# Patient Record
Sex: Female | Born: 1985 | Race: White | Hispanic: Yes | Marital: Married | Smoking: Never smoker
Health system: Southern US, Community
[De-identification: ages and names within clinical notes are randomized; demographics above are authoritative.]

## PROBLEM LIST (undated history)

## (undated) DIAGNOSIS — I10 Essential (primary) hypertension: Secondary | ICD-10-CM

## (undated) HISTORY — DX: Essential (primary) hypertension: I10

## (undated) HISTORY — PX: GALLBLADDER SURGERY: SHX652

---

## 2005-10-03 ENCOUNTER — Ambulatory Visit: Payer: Self-pay | Admitting: Family Medicine

## 2007-03-08 ENCOUNTER — Emergency Department: Payer: Self-pay | Admitting: Emergency Medicine

## 2007-03-08 ENCOUNTER — Other Ambulatory Visit: Payer: Self-pay

## 2010-01-24 ENCOUNTER — Ambulatory Visit: Payer: Self-pay | Admitting: Nurse Practitioner

## 2010-03-08 ENCOUNTER — Emergency Department: Payer: Self-pay | Admitting: Emergency Medicine

## 2010-08-07 ENCOUNTER — Emergency Department: Payer: Self-pay | Admitting: Emergency Medicine

## 2012-10-01 ENCOUNTER — Emergency Department: Payer: Self-pay | Admitting: Emergency Medicine

## 2012-10-01 LAB — COMPREHENSIVE METABOLIC PANEL
Albumin: 3.8 g/dL (ref 3.4–5.0)
Alkaline Phosphatase: 80 U/L (ref 50–136)
Anion Gap: 3 — ABNORMAL LOW (ref 7–16)
BUN: 8 mg/dL (ref 7–18)
Bilirubin,Total: 0.2 mg/dL (ref 0.2–1.0)
Calcium, Total: 8.9 mg/dL (ref 8.5–10.1)
Chloride: 106 mmol/L (ref 98–107)
Co2: 27 mmol/L (ref 21–32)
EGFR (Non-African Amer.): >60
Osmolality: 270 (ref 275–301)
SGPT (ALT): 19 U/L (ref 12–78)
Total Protein: 7.9 g/dL (ref 6.4–8.2)

## 2012-10-01 LAB — CBC
HCT: 23 % — ABNORMAL LOW (ref 35.0–47.0)
MCH: 18.9 pg — ABNORMAL LOW (ref 26.0–34.0)
MCV: 61 fL — ABNORMAL LOW (ref 80–100)
RDW: 19.2 % — ABNORMAL HIGH (ref 11.5–14.5)

## 2012-10-01 LAB — URINALYSIS, COMPLETE
Bilirubin,UR: NEGATIVE
Leukocyte Esterase: NEGATIVE
Ph: 6 (ref 4.5–8.0)
RBC,UR: 2 /HPF (ref 0–5)
Specific Gravity: 1.005 (ref 1.003–1.030)
Squamous Epithelial: 1
WBC UR: 2 /HPF (ref 0–5)

## 2012-11-30 IMAGING — CT CT ABD-PELV W/ CM
1 of 2 series · 15 of 32 positions shown, 19 images · non-contrast
Comparison: none

REASON FOR EXAM: (1) LUQ pain; (2) LUQ pain
COMMENTS:   May transport without cardiac monitor

PROCEDURE:     CT  - CT ABDOMEN / PELVIS  W  - August 08, 2010  [DATE]
RESULT:     CT abdomen and pelvis dated 08/08/2010
TECHNIQUE: Helical 3 mm sections were obtained from lung bases through the
pubic symphysis status post intravenous administration of 100 mL of
[id number] and oral contrast.

[Series 2: 3mm soft tissue · axial · 0.68mm/px · z∈[+342,+756]mm · 15 of 152 slices shown, 19 images]
[im 7/152  soft-tissue]
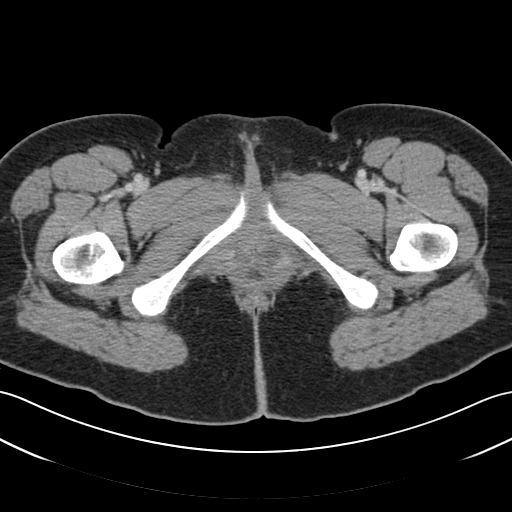
[im 7/152  bone]
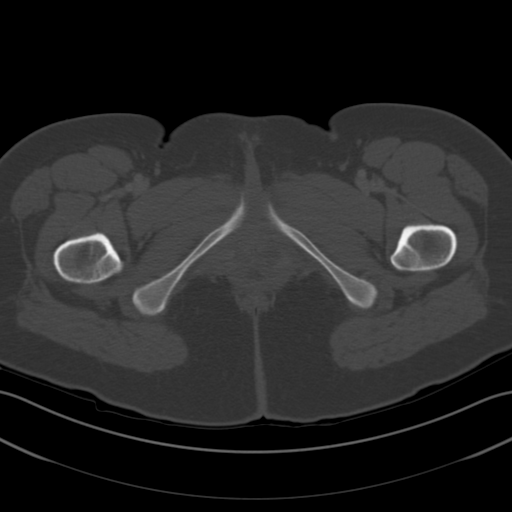
[im 19/152  soft-tissue]
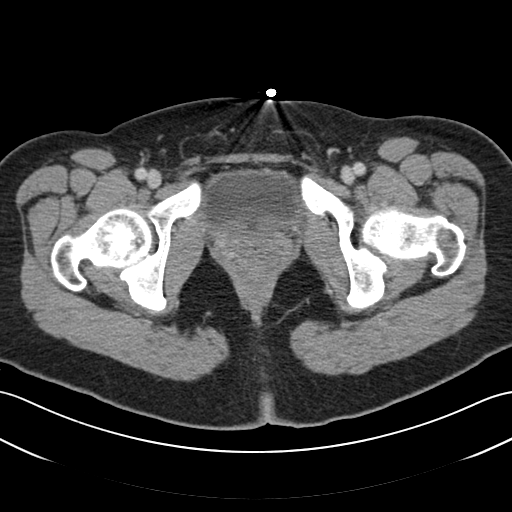
[im 32/152  soft-tissue]
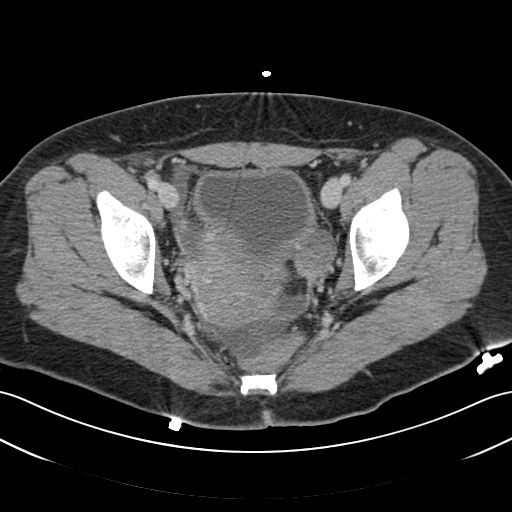
[im 45/152  soft-tissue]
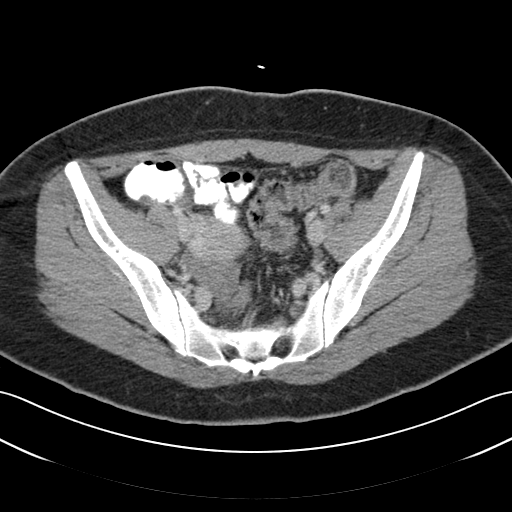
[im 51/152  soft-tissue]
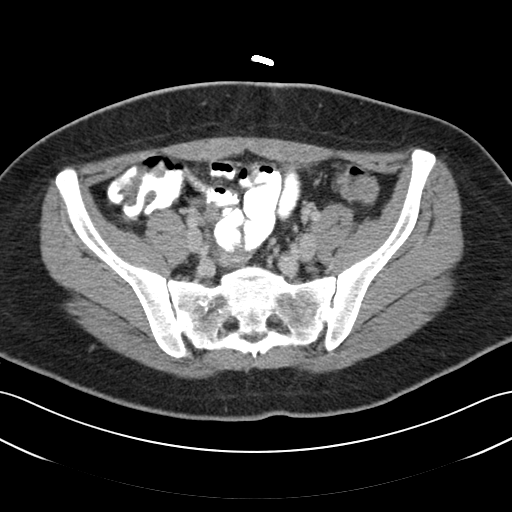
[im 63/152  soft-tissue]
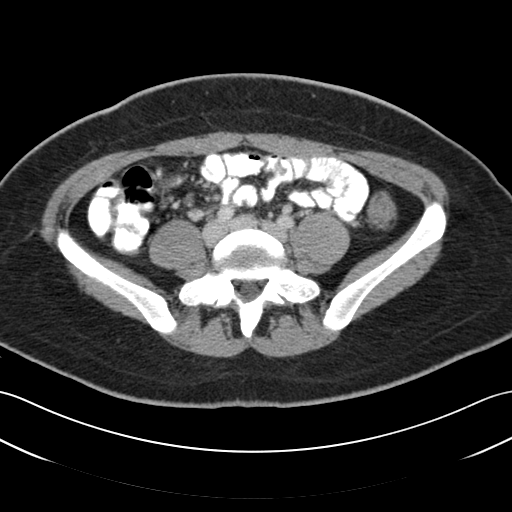
[im 76/152  soft-tissue]
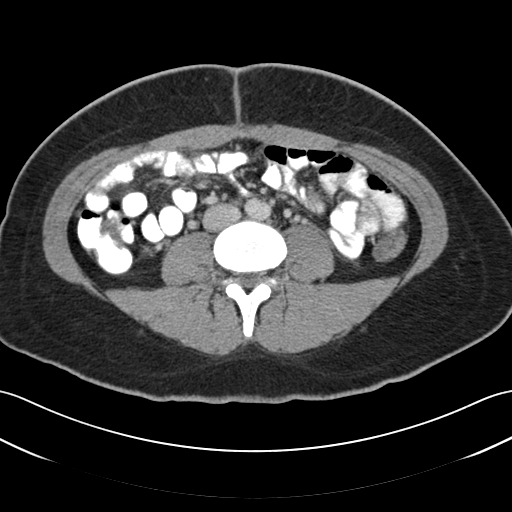
[im 89/152  soft-tissue]
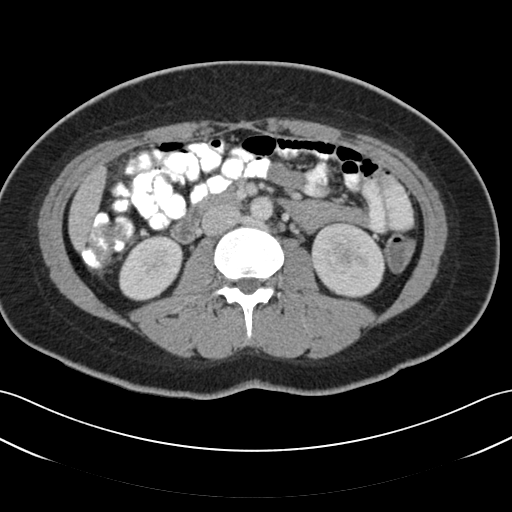
[im 101/152  soft-tissue]
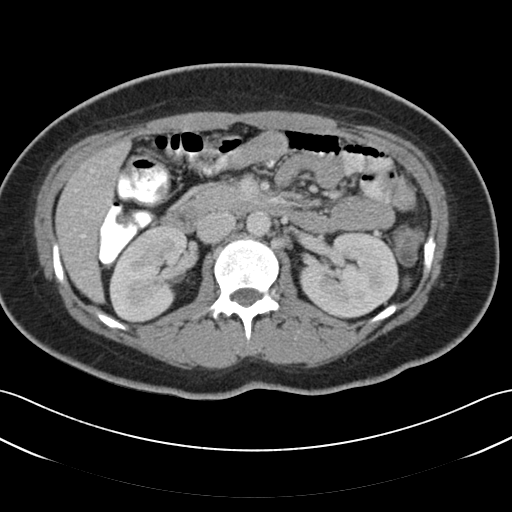
[im 101/152  bone]
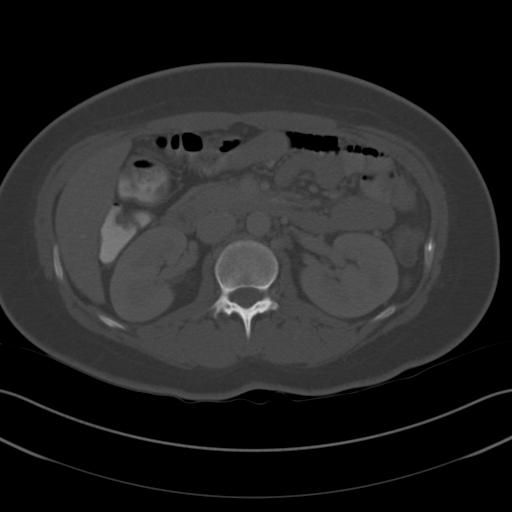
[im 107/152  soft-tissue]
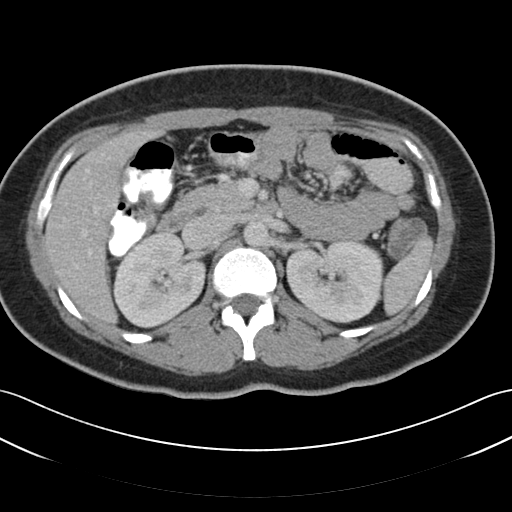
[im 120/152  soft-tissue]
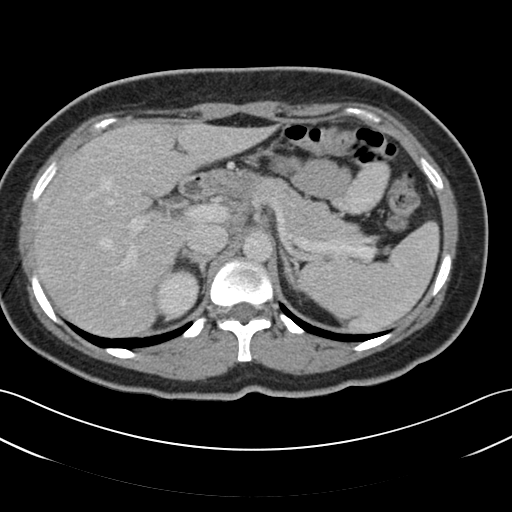
[im 126/152  lung]
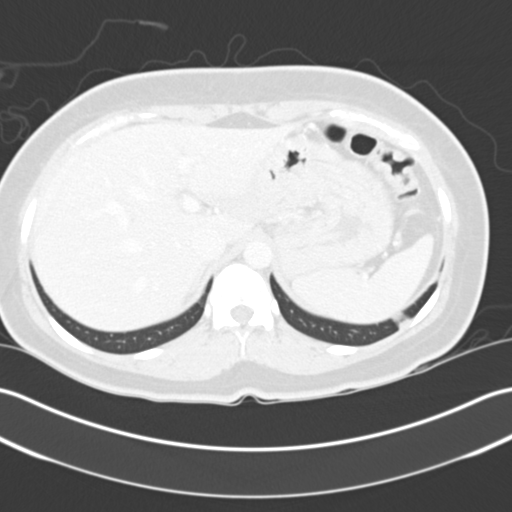
[im 133/152  soft-tissue]
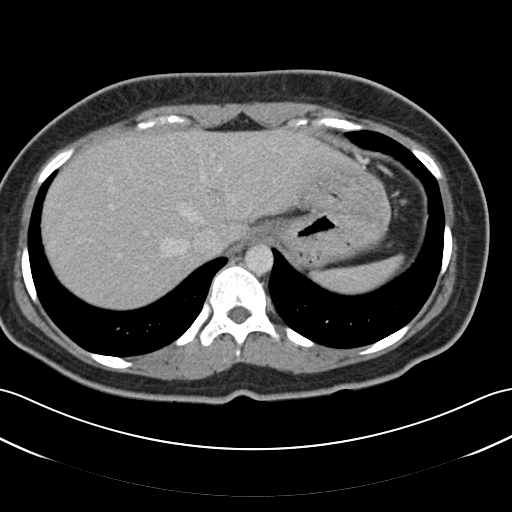
[im 133/152  lung]
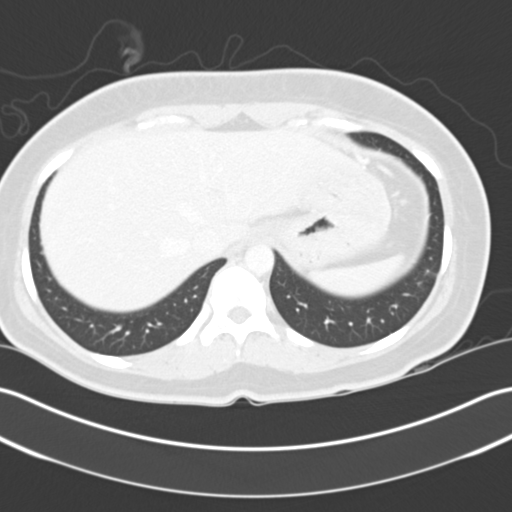
[im 139/152  lung]
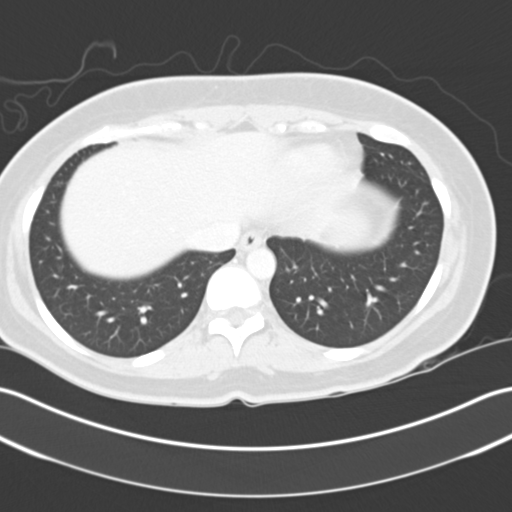
[im 145/152  soft-tissue]
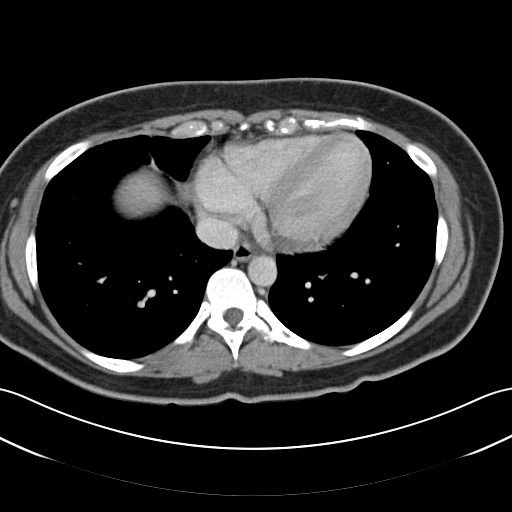
[im 145/152  lung]
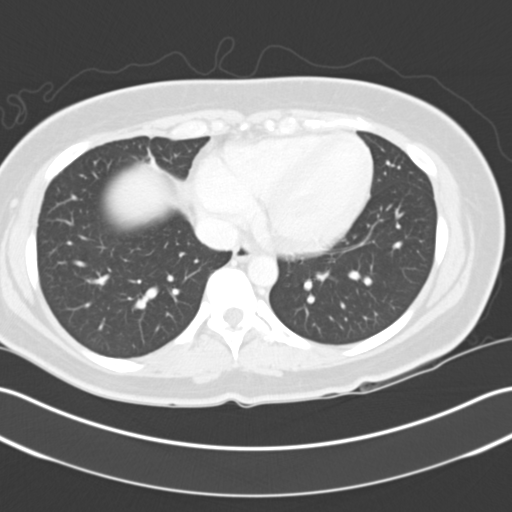

[15 of 32 positions shown; findings below may reference images not displayed]

FINDINGS: The lung bases are grossly unremarkable.

The liver, spleen, adrenals, pancreas, kidneys unremarkable. There is no CT
evidence of bowel obstruction nor secondary signs reflecting enteritis,
colitis, diverticulitis. The appendix is distended measuring 1.05 cm in
diameter. There is otherwise no evidence of appreciable periappendiceal
fluid no periappendiceal inflammatory changes. A small to moderate amount of
fluid is appreciated within the pelvis. There is no evidence of loculated
fluid collections, masses, nor adenopathy. There is no evidence of an
abdominal aortic aneurysm.
IMPRESSION: The appendix is thickened as described above without
further CT evidence of inflammatory changes. Clinical correlation
recommended.
2. Small to moderate amount fluid within the pelvis may be physiologic and
clinical correlation recommended.
3. No further CT evidence of obstructive or inflammatory abnormalities.

## 2013-08-13 ENCOUNTER — Ambulatory Visit: Payer: Self-pay | Admitting: Family Medicine

## 2016-07-08 ENCOUNTER — Ambulatory Visit (INDEPENDENT_AMBULATORY_CARE_PROVIDER_SITE_OTHER): Payer: BLUE CROSS/BLUE SHIELD | Admitting: Obstetrics & Gynecology

## 2016-07-08 ENCOUNTER — Encounter: Payer: Self-pay | Admitting: Obstetrics & Gynecology

## 2016-07-08 VITALS — BP 120/80 | HR 67 | Ht 65.0 in | Wt 193.0 lb

## 2016-07-08 DIAGNOSIS — Z124 Encounter for screening for malignant neoplasm of cervix: Secondary | ICD-10-CM | POA: Diagnosis not present

## 2016-07-08 DIAGNOSIS — Z0000 Encounter for general adult medical examination without abnormal findings: Secondary | ICD-10-CM | POA: Diagnosis not present

## 2016-07-08 DIAGNOSIS — E669 Obesity, unspecified: Secondary | ICD-10-CM | POA: Diagnosis not present

## 2016-07-08 NOTE — Progress Notes (Signed)
   HPI:      [name] is a 31 y.o. G0P0000 who LMP was No LMP recorded., she presents today for her annual examination. The patient has no complaints today. The patient is sexually active. Her last pap: was normal and 3 years ago. The patient does perform self breast exams.  There is no notable family history of breast or ovarian cancer in her family.  The patient has regular exercise: yes.  The patient denies current symptoms of depression.    GYN History: Contraception: OCP (estrogen/progesterone)  PMHx: Past Medical History:  Diagnosis Date  . Hypertension    Past Surgical History:  Procedure Laterality Date  . GALLBLADDER SURGERY     Family History  Problem Relation Age of Onset  . Lung cancer Paternal Aunt   . Throat cancer Maternal Grandfather    Social History  Substance Use Topics  . Smoking status: Never Smoker  . Smokeless tobacco: Never Used  . Alcohol use No    Current Outpatient Prescriptions:  .  lisinopril (PRINIVIL,ZESTRIL) 10 MG tablet, Take 10 mg by mouth daily., Disp: , Rfl:  .  Norgestimate-Ethinyl Estradiol Triphasic (TRI-SPRINTEC) 0.18/0.215/0.25 MG-35 MCG tablet, Take 1 tablet by mouth daily., Disp: , Rfl:  Allergies: Patient has no known allergies.  ROS  Objective: BP 120/80   Pulse 67   Ht 5\' 5"  (1.651 m)   Wt 193 lb (87.5 kg)   BMI 32.12 kg/m   Filed Weights   07/08/16 1500  Weight: 193 lb (87.5 kg)   Body mass index is 32.12 kg/m. OBGyn Exam  Assessment:  ANNUAL EXAM 1. Annual physical exam   2. Screening for cervical cancer   3. Obesity (BMI 30-39.9)      Screening Plan:            1.  Cervical Screening-  Pap smear done today  2. Breast screening- Exam annually and mammogram>40 planned   3. Colonoscopy every 10 years, Hemoccult testing - after age 31  4. Labs managed by PCP  5. Counseling for contraception: oral contraceptives (estrogen/progesterone)  Other:  1. Annual physical exam 2. Obesity Weight  loss counseled.  Diet and exercise. Meds discussed.    F/U  Return in about 1 year (around 07/08/2017) for Annual.  Annamarie MajorPaul Analyse Angst, MD, Merlinda FrederickFACOG [facility], [facility] 07/08/2016  3:22 PM

## 2016-07-08 NOTE — Patient Instructions (Signed)

## 2016-07-11 LAB — IGP, APTIMA HPV
HPV Aptima: NEGATIVE
PAP SMEAR COMMENT: 0

## 2018-02-11 ENCOUNTER — Encounter: Payer: Self-pay | Admitting: Obstetrics & Gynecology

## 2018-02-11 ENCOUNTER — Ambulatory Visit (INDEPENDENT_AMBULATORY_CARE_PROVIDER_SITE_OTHER): Payer: BLUE CROSS/BLUE SHIELD | Admitting: Obstetrics & Gynecology

## 2018-02-11 VITALS — BP 120/80 | Ht 65.0 in | Wt 212.0 lb

## 2018-02-11 DIAGNOSIS — Z01419 Encounter for gynecological examination (general) (routine) without abnormal findings: Secondary | ICD-10-CM | POA: Diagnosis not present

## 2018-02-11 DIAGNOSIS — E669 Obesity, unspecified: Secondary | ICD-10-CM

## 2018-02-11 DIAGNOSIS — Z0000 Encounter for general adult medical examination without abnormal findings: Secondary | ICD-10-CM

## 2018-02-11 MED ORDER — NORGESTIMATE-ETH ESTRADIOL 0.25-35 MG-MCG PO TABS: 1.0000 | ORAL_TABLET | Freq: Every day | ORAL | 3 refills | Status: DC

## 2018-02-11 NOTE — Patient Instructions (Signed)
PAP every three years Labs yearly (with PCP)   

## 2018-02-11 NOTE — Progress Notes (Signed)
HPI:      Jenna Reese is a 32 y.o. G0P0000 who LMP was Patient's last menstrual period was 01/22/2018., she presents today for her annual examination. The patient has no complaints today. The patient is sexually active. Her last pap: approximate date 2018 and was normal. The patient does perform self breast exams.  There is no notable family history of breast or ovarian cancer in her family.  The patient has regular exercise: yes.  The patient denies current symptoms of depression.    GYN History: Contraception: OCP (estrogen/progesterone)  PMHx: Past Medical History:  Diagnosis Date  . Hypertension    Past Surgical History:  Procedure Laterality Date  . GALLBLADDER SURGERY     Family History  Problem Relation Age of Onset  . Lung cancer Paternal Aunt   . Throat cancer Maternal Grandfather    Social History   Tobacco Use  . Smoking status: Never Smoker  . Smokeless tobacco: Never Used  Substance Use Topics  . Alcohol use: No  . Drug use: No    Current Outpatient Medications:  .  lisinopril (PRINIVIL,ZESTRIL) 10 MG tablet, Take 10 mg by mouth daily., Disp: , Rfl:  .  norgestimate-ethinyl estradiol (SPRINTEC 28) 0.25-35 MG-MCG tablet, Take 1 tablet by mouth daily., Disp: 84 tablet, Rfl: 3 Allergies: Patient has no known allergies.  Review of Systems  Constitutional: Negative for chills, fever and malaise/fatigue.  HENT: Negative for congestion, sinus pain and sore throat.   Eyes: Negative for blurred vision and pain.  Respiratory: Negative for cough and wheezing.   Cardiovascular: Negative for chest pain and leg swelling.  Gastrointestinal: Negative for abdominal pain, constipation, diarrhea, heartburn, nausea and vomiting.  Genitourinary: Negative for dysuria, frequency, hematuria and urgency.  Musculoskeletal: Negative for back pain, joint pain, myalgias and neck pain.  Skin: Negative for itching and rash.  Neurological: Negative for dizziness, tremors and  weakness.  Endo/Heme/Allergies: Does not bruise/bleed easily.  Psychiatric/Behavioral: Negative for depression. The patient is not nervous/anxious and does not have insomnia.     Objective: BP 120/80   Ht 5\' 5"  (1.651 m)   Wt 212 lb (96.2 kg)   LMP 01/22/2018   BMI 35.28 kg/m   Filed Weights   02/11/18 0804  Weight: 212 lb (96.2 kg)   Body mass index is 35.28 kg/m. Physical Exam Constitutional:      General: She is not in acute distress.    Appearance: She is well-developed.  Genitourinary:     Pelvic exam was performed with patient supine.     Vagina, uterus and rectum normal.     No lesions in the vagina.     No vaginal bleeding.     No cervical motion tenderness, friability, lesion or polyp.     Uterus is mobile.     Uterus is not enlarged.     No uterine mass detected.    Uterus is midaxial.     No right or left adnexal mass present.     Right adnexa not tender.     Left adnexa not tender.  HENT:     Head: Normocephalic and atraumatic. No laceration.     Right Ear: Hearing normal.     Left Ear: Hearing normal.     Mouth/Throat:     Pharynx: Uvula midline.  Eyes:     Pupils: Pupils are equal, round, and reactive to light.  Neck:     Musculoskeletal: Normal range of motion and neck supple.  Thyroid: No thyromegaly.  Cardiovascular:     Rate and Rhythm: Normal rate and regular rhythm.     Heart sounds: No murmur. No friction rub. No gallop.   Pulmonary:     Effort: Pulmonary effort is normal. No respiratory distress.     Breath sounds: Normal breath sounds. No wheezing.  Chest:     Breasts:        Right: No mass, skin change or tenderness.        Left: No mass, skin change or tenderness.  Abdominal:     General: Bowel sounds are normal. There is no distension.     Palpations: Abdomen is soft.     Tenderness: There is no abdominal tenderness. There is no rebound.  Musculoskeletal: Normal range of motion.  Neurological:     Mental Status: She is alert  and oriented to person, place, and time.     Cranial Nerves: No cranial nerve deficit.  Skin:    General: Skin is warm and dry.  Psychiatric:        Judgment: Judgment normal.  Vitals signs reviewed.     Assessment:  ANNUAL EXAM 1. Annual physical exam   2. Obesity (BMI 30-39.9)    Screening Plan:            1.  Cervical Screening-  Pap smear schedule reviewed with patient  2. Breast screening- Exam annually and mammogram>40 planned   3. Labs managed by PCP  4. Counseling for contraception: oral contraceptives (estrogen/progesterone)   5. Obesity (BMI 30-39.9) Diet and exercise discussed  Pt plans trying for pregnancy next year    F/U  Return in about 1 year (around 02/12/2019) for Annual.  Jenna MajorPaul Eidan Muellner, MD, Merlinda FrederickFACOG [facility], [facility] 02/11/2018  8:19 AM

## 2018-12-12 ENCOUNTER — Other Ambulatory Visit: Payer: Self-pay

## 2018-12-12 ENCOUNTER — Emergency Department
Admission: EM | Admit: 2018-12-12 | Discharge: 2018-12-12 | Disposition: A | Discharge status: Home / Self Care | Payer: Managed Care, Other (non HMO) | Attending: Emergency Medicine | Admitting: Emergency Medicine

## 2018-12-12 ENCOUNTER — Emergency Department: Payer: Managed Care, Other (non HMO)

## 2018-12-12 DIAGNOSIS — S99922A Unspecified injury of left foot, initial encounter: Secondary | ICD-10-CM | POA: Diagnosis present

## 2018-12-12 DIAGNOSIS — S92505A Nondisplaced unspecified fracture of left lesser toe(s), initial encounter for closed fracture: Secondary | ICD-10-CM

## 2018-12-12 DIAGNOSIS — Y929 Unspecified place or not applicable: Secondary | ICD-10-CM | POA: Diagnosis not present

## 2018-12-12 DIAGNOSIS — Y999 Unspecified external cause status: Secondary | ICD-10-CM | POA: Diagnosis not present

## 2018-12-12 DIAGNOSIS — W228XXA Striking against or struck by other objects, initial encounter: Secondary | ICD-10-CM | POA: Insufficient documentation

## 2018-12-12 DIAGNOSIS — I10 Essential (primary) hypertension: Secondary | ICD-10-CM | POA: Insufficient documentation

## 2018-12-12 DIAGNOSIS — Y939 Activity, unspecified: Secondary | ICD-10-CM | POA: Diagnosis not present

## 2018-12-12 DIAGNOSIS — Z79899 Other long term (current) drug therapy: Secondary | ICD-10-CM | POA: Insufficient documentation

## 2018-12-12 MED ORDER — HYDROCODONE-ACETAMINOPHEN 5-325 MG PO TABS: 1.0000 | ORAL_TABLET | Freq: Four times a day (QID) | ORAL | 0 refills | Status: DC | PRN

## 2018-12-12 MED ORDER — IBUPROFEN 800 MG PO TABS: 800.0000 mg | ORAL_TABLET | Freq: Three times a day (TID) | ORAL | 0 refills | Status: DC | PRN

## 2018-12-12 MED ORDER — KETOROLAC TROMETHAMINE 30 MG/ML IJ SOLN
30.0000 mg | Freq: Once | INTRAMUSCULAR | Status: AC
  Administered 2018-12-12: 30 mg via INTRAMUSCULAR
  Filled 2018-12-12: qty 1

## 2018-12-12 NOTE — ED Triage Notes (Signed)
Pt states she stubbed her left 5th toe approx 1 hour pta. Pt ambulatory without difficulty.

## 2018-12-12 NOTE — ED Notes (Signed)
Pt back from x-ray.

## 2018-12-12 NOTE — Discharge Instructions (Signed)
1.  You may take pain medicines as needed (Motrin/Norco #15). 2.  Buddy tape your left fourth and fifth toes together.  Wear podiatric shoe as needed for comfort. 3.  Return to the ER for worsening symptoms, persistent vomiting, difficulty breathing or other concerns.

## 2018-12-12 NOTE — ED Provider Notes (Signed)
[facility] Emergency Department Provider Note   ____________________________________________   First MD Initiated Contact with Patient 12/12/18 [phone]     (approximate)  I have reviewed the triage vital signs and the nursing notes.   HISTORY  Chief Complaint Toe Injury    HPI Jenna Reese is a 33 y.o. female who presents to the ED from home with a painful left pinky toe.  Patient states she stubbed her toe approximately 2 AM.  No medicines taken prior to arrival.  Voices no other complaints or injuries.      Past Medical History:  Diagnosis Date  . Hypertension     Patient Active Problem List   Diagnosis Date Noted  . Obesity (BMI 30-39.9) 07/08/2016    Past Surgical History:  Procedure Laterality Date  . GALLBLADDER SURGERY      Prior to Admission medications   Medication Sig Start Date End Date Taking? Authorizing Provider  HYDROcodone-acetaminophen (NORCO) 5-325 MG tablet Take 1 tablet by mouth every 6 (six) hours as needed for moderate pain. 12/12/18   Paulette Blanch, MD  ibuprofen (ADVIL) 800 MG tablet Take 1 tablet (800 mg total) by mouth every 8 (eight) hours as needed for moderate pain. 12/12/18   Paulette Blanch, MD  lisinopril (PRINIVIL,ZESTRIL) 10 MG tablet Take 10 mg by mouth daily.    [provider]  norgestimate-ethinyl estradiol ([place] 28) 0.25-35 MG-MCG tablet Take 1 tablet by mouth daily. 02/11/18 05/06/18  Gae Dry, MD    Allergies Patient has no known allergies.  Family History  Problem Relation Age of Onset  . Lung cancer Paternal Aunt   . Throat cancer Maternal Grandfather     Social History Social History   Tobacco Use  . Smoking status: Never Smoker  . Smokeless tobacco: Never Used  Substance Use Topics  . Alcohol use: No  . Drug use: No    Review of Systems  Constitutional: No fever/chills Eyes: No visual changes. ENT: No sore throat. Cardiovascular: Denies chest pain.  Respiratory: Denies shortness of breath. Gastrointestinal: No abdominal pain.  No nausea, no vomiting.  No diarrhea.  No constipation. Genitourinary: Negative for dysuria. Musculoskeletal: Positive for left fifth toe pain.  Negative for back pain. Skin: Negative for rash. Neurological: Negative for headaches, focal weakness or numbness.   ____________________________________________   PHYSICAL EXAM:  VITAL SIGNS: ED Triage Vitals  Enc Vitals Group     BP 12/12/18 0316 (!) 155/99     Pulse Rate 12/12/18 0316 86     Resp 12/12/18 0316 18     Temp 12/12/18 0316 98.3 F (36.8 C)     Temp Source 12/12/18 0316 Oral     SpO2 12/12/18 0316 100 %     Weight 12/12/18 0303 200 lb (90.7 kg)     Height 12/12/18 0303 5\' 5"  (1.651 m)     Head Circumference --      Peak Flow --      Pain Score 12/12/18 0303 6     Pain Loc --      Pain Edu? --      Excl. in [place]? --     Constitutional: Alert and oriented. Well appearing and in no acute distress. Eyes: Conjunctivae are normal. PERRL. EOMI. Head: Atraumatic. Nose: No congestion/rhinnorhea. Mouth/Throat: Mucous membranes are moist.  Oropharynx non-erythematous. Neck: No stridor.   Cardiovascular: Normal rate, regular rhythm. Grossly normal heart sounds.  Good peripheral circulation. Respiratory: Normal respiratory effort.  No  retractions. Lungs CTAB. Gastrointestinal: Soft and nontender. No distention. No abdominal bruits. No CVA tenderness. Musculoskeletal:  Left fifth toe with mild swelling and tenderness to palpation.  Decreased range of motion secondary to pain.  2+ distal pulses.  Brisk, less than 5-second capillary refill. Neurologic:  Normal speech and language. No gross focal neurologic deficits are appreciated. No gait instability. Skin:  Skin is warm, dry and intact. No rash noted. Psychiatric: Mood and affect are normal. Speech and behavior are normal.  ____________________________________________   LABS (all labs ordered are  listed, but only abnormal results are displayed)  Labs Reviewed - No data to display ____________________________________________  EKG  None ____________________________________________  RADIOLOGY  ED MD interpretation: Nondisplaced fifth proximal phalanx fracture  Official radiology report(s): Dg Foot Complete Left  Result Date: 12/12/2018 CLINICAL DATA:  Left fifth toe pain EXAM: LEFT FOOT - COMPLETE 3+ VIEW COMPARISON:  None. FINDINGS: There is a nondisplaced obliquely oriented fracture seen through the base of the fifth proximal phalanx. No intra-articular extension is seen. Overlying soft tissue swelling is noted. IMPRESSION: Nondisplaced fracture at the base of the fifth proximal phalanx. Overlying soft tissue swelling. Electronically Signed   By: Jonna Clark M.D.   On: 12/12/2018 03:32    ____________________________________________   PROCEDURES  Procedure(s) performed (including Critical Care):  Procedures   ____________________________________________   INITIAL IMPRESSION / ASSESSMENT AND PLAN / ED COURSE  As part of my medical decision making, I reviewed the following data within the electronic MEDICAL RECORD NUMBER Nursing notes reviewed and incorporated, Old chart reviewed, Radiograph reviewed, Notes from prior ED visits and [place] Controlled Substance Database     Jenna Reese was evaluated in Emergency Department on 12/12/2018 for the symptoms described in the history of present illness. She was evaluated in the context of the global COVID-19 pandemic, which necessitated consideration that the patient might be at risk for infection with the SARS-CoV-2 virus that causes COVID-19. Institutional protocols and algorithms that pertain to the evaluation of patients at risk for COVID-19 are in a state of rapid change based on information released by regulatory bodies including the CDC and federal and state organizations. These policies and algorithms were followed during  the patient's care in the ED.    33 year old female who presents with left fifth toe fracture.  Will buddy tape, administer Toradol, podiatric shoe and patient will follow up with podiatry as needed.  Strict return precautions given.  Patient verbalizes understanding and agrees with plan of care.      ____________________________________________   FINAL CLINICAL IMPRESSION(S) / ED DIAGNOSES  Final diagnoses:  Closed nondisplaced fracture of phalanx of lesser toe of left foot, unspecified phalanx, initial encounter     ED Discharge Orders         Ordered    ibuprofen (ADVIL) 800 MG tablet  Every 8 hours PRN     12/12/18 0500    HYDROcodone-acetaminophen (NORCO) 5-325 MG tablet  Every 6 hours PRN     12/12/18 0500           Note:  This document was prepared using Dragon voice recognition software and may include unintentional dictation errors.   Irean Hong, MD 12/12/18 [phone]

## 2019-02-15 ENCOUNTER — Ambulatory Visit: Payer: BLUE CROSS/BLUE SHIELD | Admitting: Obstetrics & Gynecology

## 2019-06-07 ENCOUNTER — Ambulatory Visit: Payer: Self-pay | Admitting: Obstetrics & Gynecology

## 2019-09-01 ENCOUNTER — Encounter: Payer: Self-pay | Admitting: Obstetrics & Gynecology

## 2019-09-01 ENCOUNTER — Other Ambulatory Visit (HOSPITAL_COMMUNITY)
Admission: RE | Admit: 2019-09-01 | Discharge: 2019-09-01 | Disposition: A | Discharge status: Home / Self Care | Payer: BC Managed Care – PPO | Source: Ambulatory Visit | Attending: Obstetrics & Gynecology | Admitting: Obstetrics & Gynecology

## 2019-09-01 ENCOUNTER — Other Ambulatory Visit: Payer: Self-pay

## 2019-09-01 ENCOUNTER — Ambulatory Visit (INDEPENDENT_AMBULATORY_CARE_PROVIDER_SITE_OTHER): Payer: BC Managed Care – PPO | Admitting: Obstetrics & Gynecology

## 2019-09-01 VITALS — BP 120/80 | Ht 65.0 in | Wt 186.0 lb

## 2019-09-01 DIAGNOSIS — Z124 Encounter for screening for malignant neoplasm of cervix: Secondary | ICD-10-CM | POA: Diagnosis present

## 2019-09-01 DIAGNOSIS — Z1329 Encounter for screening for other suspected endocrine disorder: Secondary | ICD-10-CM

## 2019-09-01 DIAGNOSIS — Z01419 Encounter for gynecological examination (general) (routine) without abnormal findings: Secondary | ICD-10-CM

## 2019-09-01 DIAGNOSIS — I10 Essential (primary) hypertension: Secondary | ICD-10-CM | POA: Diagnosis not present

## 2019-09-01 DIAGNOSIS — Z3041 Encounter for surveillance of contraceptive pills: Secondary | ICD-10-CM

## 2019-09-01 DIAGNOSIS — Z130 Encounter for screening for diseases of the blood and blood-forming organs and certain disorders involving the immune mechanism: Secondary | ICD-10-CM

## 2019-09-01 MED ORDER — LABETALOL HCL 200 MG PO TABS: 200.0000 mg | ORAL_TABLET | Freq: Two times a day (BID) | ORAL | 11 refills | Status: DC

## 2019-09-01 NOTE — Progress Notes (Signed)
   HPI:      [name] is a 34 y.o. G0P0000 who LMP was Patient's last menstrual period was 08/04/2019., she presents today for her annual examination. The patient has no complaints today. The patient is sexually active. Her last pap: approximate date 2018 and was normal. The patient does perform self breast exams.  There is no notable family history of breast or ovarian cancer in her family.  The patient has regular exercise: yes.  The patient denies current symptoms of depression.    GYN History: Contraception: OCP (estrogen/progesterone)  PMHx: Past Medical History:  Diagnosis Date  . Hypertension    Past Surgical History:  Procedure Laterality Date  . GALLBLADDER SURGERY     Family History  Problem Relation Age of Onset  . Lung cancer Paternal Aunt   . Throat cancer Maternal Grandfather    Social History   Tobacco Use  . Smoking status: Never Smoker  . Smokeless tobacco: Never Used  Vaping Use  . Vaping Use: Never used  Substance Use Topics  . Alcohol use: No  . Drug use: No    Current Outpatient Medications:  .  lisinopril (PRINIVIL,ZESTRIL) 10 MG tablet, Take 10 mg by mouth daily., Disp: , Rfl:  Allergies: Patient has no known allergies.  ROS  Objective: BP 120/80   Ht 5\' 5"  (1.651 m)   Wt 186 lb (84.4 kg)   LMP 08/04/2019   BMI 30.95 kg/m   Filed Weights   09/01/19 0803  Weight: 186 lb (84.4 kg)   Body mass index is 30.95 kg/m. OBGyn Exam  Assessment:  ANNUAL EXAM 1. Women's annual routine gynecological examination   2. Screening for cervical cancer   3. Encounter for surveillance of contraceptive pills   4. Chronic hypertension      Screening Plan:            1.  Cervical Screening-  Pap smear done today  2. Breast screening- Exam annually and mammogram>40 planned   3. Plans pregnancy soon.  PNV.  CHange HTN Meds to preg safe Labetalol.  Alternatives discussed as well.  4. Labs Ordered today  5. Counseling for contraception:  no method   6. Chronic hypertension Normotensive on Lisinopril for years Pregnancy planning, so change in meds today Monitor      F/U  Return in about 1 year (around 08/31/2020) for Annual.  11/01/2020, MD, Annamarie Major [facility], [facility] 09/01/2019  8:26 AM

## 2019-09-01 NOTE — Patient Instructions (Signed)
Preparing for Pregnancy °If you are considering becoming pregnant, make an appointment to see your regular health care provider to learn how to prepare for a safe and healthy pregnancy (preconception care). During a preconception care visit, your health care provider will: °· Do a complete physical exam, including a Pap test. °· Take a complete medical history. °· Give you information, answer your questions, and help you resolve problems. °Preconception checklist °Medical history °· Tell your health care provider about any current or past medical conditions. Your pregnancy or your ability to become pregnant may be affected by chronic conditions, such as diabetes, chronic hypertension, and thyroid problems. °· Include your family's medical history as well as your partner's medical history. °· Tell your health care provider about any history of STIs (sexually transmitted infections). These can affect your pregnancy. In some cases, they can be passed to your baby. Discuss any concerns that you have about STIs. °· If indicated, discuss the benefits of genetic testing. This testing will show whether there are any genetic conditions that may be passed from you or your partner to your baby. °· Tell your health care provider about: °? Any problems you have had with conception or pregnancy. °? Any medicines you take. These include vitamins, herbal supplements, and over-the-counter medicines. °? Your history of immunizations. Discuss any vaccinations that you may need. °Diet °· Ask your health care provider what to include in a healthy diet that has a balance of nutrients. This is especially important when you are pregnant or preparing to become pregnant. °· Ask your health care provider to help you reach a healthy weight before pregnancy. °? If you are overweight, you may be at higher risk for certain complications, such as high blood pressure, diabetes, and preterm birth. °? If you are underweight, you are more likely to  have a baby who has a low birth weight. °Lifestyle, work, and home °· Let your health care provider know: °? About any lifestyle habits that you have, such as alcohol use, drug use, or smoking. °? About recreational activities that may put you at risk during pregnancy, such as downhill skiing and certain exercise programs. °? Tell your health care provider about any international travel, especially any travel to places with an active Zika virus outbreak. °? About harmful substances that you may be exposed to at work or at home. These include chemicals, pesticides, radiation, or even litter boxes. °? If you do not feel safe at home. °Mental health °· Tell your health care provider about: °? Any history of mental health conditions, including feelings of depression, sadness, or anxiety. °? Any medicines that you take for a mental health condition. These include herbs and supplements. °Home instructions to prepare for pregnancy °Lifestyle ° °· Eat a balanced diet. This includes fresh fruits and vegetables, whole grains, lean meats, low-fat dairy products, healthy fats, and foods that are high in fiber. Ask to meet with a nutritionist or registered dietitian for assistance with meal planning and goals. °· Get regular exercise. Try to be active for at least 30 minutes a day on most days of the week. Ask your health care provider which activities are safe during pregnancy. °· Do not use any products that contain nicotine or tobacco, such as cigarettes and e-cigarettes. If you need help quitting, ask your health care provider. °· Do not drink alcohol. °· Do not take illegal drugs. °· Maintain a healthy weight. Ask your health care provider what weight range is right for you. °General   instructions °· Keep an accurate record of your menstrual periods. This makes it easier for your health care provider to determine your baby's due date. °· Begin taking prenatal vitamins and folic acid supplements daily as directed by your  health care provider. °· Manage any chronic conditions, such as high blood pressure and diabetes, as told by your health care provider. This is important. °How do I know that I am pregnant? °You may be pregnant if you have been sexually active and you miss your period. Symptoms of early pregnancy include: °· Mild cramping. °· Very light vaginal bleeding (spotting). °· Feeling unusually tired. °· Nausea and vomiting (morning sickness). °If you have any of these symptoms and you suspect that you might be pregnant, you can take a home pregnancy test. These tests check for a hormone in your urine (human chorionic gonadotropin, or hCG). A woman's body begins to make this hormone during early pregnancy. These tests are very accurate. Wait until at least the first day after you miss your period to take one. If the test shows that you are pregnant (you get a positive result), call your health care provider to make an appointment for prenatal care. °What should I do if I become pregnant? ° °  ° °· Make an appointment with your health care provider as soon as you suspect you are pregnant. °· Do not use any products that contain nicotine, such as cigarettes, chewing tobacco, and e-cigarettes. If you need help quitting, ask your health care provider. °· Do not drink alcoholic beverages. Alcohol is related to a number of birth defects. °· Avoid toxic odors and chemicals. °· You may continue to have sexual intercourse if it does not cause pain or other problems, such as vaginal bleeding. °This information is not intended to replace advice given to you by your health care provider. Make sure you discuss any questions you have with your health care provider. °Document Revised: 02/13/2017 Document Reviewed: 09/03/2015 °Elsevier Patient Education © 2020 [employer] ° °

## 2019-09-02 LAB — CBC WITH DIFFERENTIAL
Basophils Absolute: 0 10*3/uL (ref 0.0–0.2)
Basos: 0 %
EOS (ABSOLUTE): 0.1 10*3/uL (ref 0.0–0.4)
Eos: 1 %
Hematocrit: 36.2 % (ref 34.0–46.6)
Hemoglobin: 11.6 g/dL (ref 11.1–15.9)
Immature Grans (Abs): 0 10*3/uL (ref 0.0–0.1)
Immature Granulocytes: 0 %
Lymphocytes Absolute: 4 10*3/uL — ABNORMAL HIGH (ref 0.7–3.1)
Lymphs: 40 %
MCH: 27.4 pg (ref 26.6–33.0)
MCHC: 32 g/dL (ref 31.5–35.7)
MCV: 85 fL (ref 79–97)
Monocytes Absolute: 0.6 10*3/uL (ref 0.1–0.9)
Monocytes: 6 %
Neutrophils Absolute: 5.4 10*3/uL (ref 1.4–7.0)
Neutrophils: 53 %
RBC: 4.24 x10E6/uL (ref 3.77–5.28)
RDW: 13.1 % (ref 11.7–15.4)
WBC: 10.1 10*3/uL (ref 3.4–10.8)

## 2019-09-02 LAB — TSH: TSH: 2.96 u[IU]/mL (ref 0.450–4.500)

## 2019-09-03 LAB — CYTOLOGY - PAP
Comment: NEGATIVE
Diagnosis: NEGATIVE
High risk HPV: NEGATIVE

## 2020-09-14 ENCOUNTER — Ambulatory Visit: Payer: Self-pay | Admitting: Obstetrics & Gynecology

## 2020-09-28 ENCOUNTER — Other Ambulatory Visit: Payer: Self-pay | Admitting: Obstetrics & Gynecology

## 2020-09-28 DIAGNOSIS — I10 Essential (primary) hypertension: Secondary | ICD-10-CM

## 2020-09-28 MED ORDER — LABETALOL HCL 200 MG PO TABS: 200.0000 mg | ORAL_TABLET | Freq: Two times a day (BID) | ORAL | 2 refills | Status: DC

## 2020-10-26 ENCOUNTER — Other Ambulatory Visit: Payer: Self-pay

## 2020-10-26 ENCOUNTER — Encounter: Payer: Self-pay | Admitting: Obstetrics

## 2020-10-26 ENCOUNTER — Ambulatory Visit (INDEPENDENT_AMBULATORY_CARE_PROVIDER_SITE_OTHER): Payer: BC Managed Care – PPO | Admitting: Obstetrics

## 2020-10-26 VITALS — BP 110/70 | Ht 65.0 in | Wt 187.0 lb

## 2020-10-26 DIAGNOSIS — Z01419 Encounter for gynecological examination (general) (routine) without abnormal findings: Secondary | ICD-10-CM | POA: Diagnosis not present

## 2020-10-26 DIAGNOSIS — I10 Essential (primary) hypertension: Secondary | ICD-10-CM | POA: Diagnosis not present

## 2020-10-26 DIAGNOSIS — Z124 Encounter for screening for malignant neoplasm of cervix: Secondary | ICD-10-CM

## 2020-10-26 MED ORDER — LABETALOL HCL 200 MG PO TABS: 200.0000 mg | ORAL_TABLET | Freq: Two times a day (BID) | ORAL | 11 refills | Status: AC

## 2020-10-26 NOTE — Progress Notes (Signed)
Gynecology Annual Exam   PCP: Patient, No Pcp Per (Inactive)  Chief Complaint:  Chief Complaint  Patient presents with   Annual Exam    Late period     History of Present Illness: Patient is a 35 y.o. G0P0000 presents for annual exam. 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 She has no children, is married , and would welcome a pregnancy, but is not actively trying to conceive.The patient has no complaints today. She does report some changes in her cycle this year, and shares that she has gained 20 lbs this year.  LMP: Patient's last menstrual period was 09/11/2020. Average Interval: regular, 26 days Duration of flow: 3 days Heavy Menses: no Clots: no Intermenstrual Bleeding: no Postcoital Bleeding: no Dysmenorrhea: no  The patient is sexually active. She currently uses none for contraception. She denies dyspareunia.  The patient does not perform self breast exams.  There is no notable family history of breast or ovarian cancer in her family.  The patient wears seatbelts: yes.   The patient has regular exercise: no.    The patient denies current symptoms of depression.    Review of Systems: Review of Systems  Constitutional: Negative.   HENT: Negative.    Eyes: Negative.   Cardiovascular: Negative.   Gastrointestinal: Negative.   Genitourinary: Negative.   Musculoskeletal: Negative.   Skin: Negative.   Neurological: Negative.   Endo/Heme/Allergies: Negative.   Psychiatric/Behavioral: Negative.     Past Medical History:  Patient Active Problem List   Diagnosis Date Noted   Chronic hypertension 09/01/2019   Obesity (BMI 30-39.9) 07/08/2016    Past Surgical History:  Past Surgical History:  Procedure Laterality Date   GALLBLADDER SURGERY      Gynecologic History:  Patient's last menstrual period was 09/11/2020. Contraception: none Last Pap: Results were: no abnormalities   Obstetric History: G0P0000  Family History:  Family History  Problem Relation Age of  Onset   Lung cancer Paternal Aunt    Throat cancer Maternal Grandfather     Social History:  Social History   Socioeconomic History   Marital status: Married    Spouse name: Not on file   Number of children: Not on file   Years of education: Not on file   Highest education level: Not on file  Occupational History   Not on file  Tobacco Use   Smoking status: Never   Smokeless tobacco: Never  Vaping Use   Vaping Use: Never used  Substance and Sexual Activity   Alcohol use: No   Drug use: No   Sexual activity: Yes  Other Topics Concern   Not on file  Social History Narrative   Not on file   Social Determinants of Health   Financial Resource Strain: Not on file  Food Insecurity: Not on file  Transportation Needs: Not on file  Physical Activity: Not on file  Stress: Not on file  Social Connections: Not on file  Intimate Partner Violence: Not on file    Allergies:  No Known Allergies  Medications: Prior to Admission medications   Medication Sig Start Date End Date Taking? Authorizing Provider  labetalol (NORMODYNE) 200 MG tablet Take 1 tablet (200 mg total) by mouth 2 (two) times daily. 09/28/20  Yes Nadara Mustard, MD    Physical Exam Vitals: Blood pressure 110/70, height 5\' 5"  (1.651 m), weight 187 lb (84.8 kg), last menstrual period 09/11/2020.  General: NAD HEENT: normocephalic, anicteric Thyroid: no enlargement, no palpable  nodules Pulmonary: No increased work of breathing, CTAB Cardiovascular: RRR, distal pulses 2+ Breast: Breast symmetrical, no tenderness, no palpable nodules or masses, no skin or nipple retraction present, no nipple discharge.  No axillary or supraclavicular lymphadenopathy. Abdomen: NABS, soft, non-tender, non-distended.  Umbilicus without lesions.  No hepatomegaly, splenomegaly or masses palpable. No evidence of hernia  Genitourinary:  External: Normal external female genitalia.  Normal urethral meatus, normal Bartholin's and Skene's  glands.    Vagina: Normal vaginal mucosa, no evidence of prolapse.    Cervix: Grossly normal in appearance, no bleeding  Uterus: Non-enlarged, mobile, normal contour.  No CMT  Adnexa: ovaries non-enlarged, no adnexal masses  Rectal: deferred  Lymphatic: no evidence of inguinal lymphadenopathy Extremities: no edema, erythema, or tenderness Neurologic: Grossly intact Psychiatric: mood appropriate, affect full  Female chaperone present for pelvic and breast  portions of the physical exam    Assessment: 35 y.o. G0P0000 routine annual exam  Plan: Problem List Items Addressed This Visit   None Visit Diagnoses     Women's annual routine gynecological examination    -  Primary   Cervical cancer screening           2) STI screening  wasoffered and declined, and therefore not obtained.  2)  ASCCP guidelines and rational discussed.  Patient opts for every 3 years screening interval  3) Contraception - the patient is currently using  none.  She is  not actively seeking a pregnancy, but would welcome one. Advised to continue on a daily multivitamin.  4) Routine healthcare maintenance including cholesterol, diabetes screening discussed managed by PCP  5) Return in about 1 year (around 10/26/2021) for annual.  Mirna Mires, CNM  10/26/2020 11:22 AM   [facility], [facility] 10/26/2020, 11:22 AM

## 2020-11-07 ENCOUNTER — Ambulatory Visit: Payer: Self-pay | Admitting: Obstetrics & Gynecology

## 2021-05-30 ENCOUNTER — Encounter: Payer: Self-pay | Admitting: Obstetrics

## 2021-05-30 ENCOUNTER — Ambulatory Visit: Payer: BC Managed Care – PPO | Admitting: Obstetrics

## 2021-05-30 VITALS — BP 142/80 | Ht 65.0 in | Wt 204.0 lb

## 2021-05-30 DIAGNOSIS — R399 Unspecified symptoms and signs involving the genitourinary system: Secondary | ICD-10-CM | POA: Diagnosis not present

## 2021-05-30 NOTE — Progress Notes (Signed)
[name] is a 36 y.o. G0P0000 who LMP was No LMP recorded. (Menstrual status: Irregular Periods)., presents today for a problem visit.  She complains of burning with urination, dysuria, frequency, and incomplete bladder emptying . She has had symptoms for 5 days. Patient also complains of back pain. Symptoms are moderate.  Patient denies fever and vaginal discharge. Patient does not have a history of recurrent UTI,  does not have a history of pyelonephritis, does not have a history of nephrolithiasis.  She has not had previous treatment for her current symptoms.  She did take some AZO to address the bladder pressure. ?She was worked in this afternoon for an appointment. ?She is not due for an annual- this is up to date. ? ?Review of Systems  ?Constitutional: Negative.   ?HENT: Negative.    ?Eyes: Negative.   ?Respiratory: Negative.    ?Cardiovascular: Negative.   ?Gastrointestinal: Negative.   ?Genitourinary:  Positive for frequency and urgency.  ?Musculoskeletal: Negative.   ?Skin: Negative.   ?Neurological: Negative.   ?Endo/Heme/Allergies: Negative.   ?Psychiatric/Behavioral: Negative.     ?Physical Exam ?Constitutional:   ?   Appearance: Normal appearance. She is obese.  ?HENT:  ?   Head: Normocephalic and atraumatic.  ?Cardiovascular:  ?   Rate and Rhythm: Normal rate and regular rhythm.  ?   Pulses: Normal pulses.  ?   Heart sounds: Normal heart sounds.  ?Pulmonary:  ?   Effort: Pulmonary effort is normal.  ?   Breath sounds: Normal breath sounds.  ?Genitourinary: ?   Comments: Deferred. Clean catch urine sample retrieved. ?Musculoskeletal:     ?   General: Normal range of motion.  ?   Cervical back: Normal range of motion and neck supple.  ?Skin: ?   General: Skin is warm and dry.  ?Neurological:  ?   Mental Status: She is alert.  ?   ? ?A: UTI symptoms ?Plan: Urine sample shows negative leuks, nitrites and bacteria, likely due to her use of AZO. We will send the urine sample for culture.  Macrobid RX sent to her pharmacy. She will be notified of the lab results. ?Encouraged her to take the AZO and/or a daily Cranberry extract capsule. Also suggested she try limited probiotics after taking the macrobid. ? ?RTC PRN or if not improved. ? ?Mirna Mires, CNM  ?05/30/2021 5:18 PM   ?

## 2021-05-31 ENCOUNTER — Other Ambulatory Visit: Payer: Self-pay | Admitting: Obstetrics

## 2021-05-31 ENCOUNTER — Encounter: Payer: Self-pay | Admitting: Obstetrics

## 2021-05-31 DIAGNOSIS — R399 Unspecified symptoms and signs involving the genitourinary system: Secondary | ICD-10-CM

## 2021-05-31 MED ORDER — NITROFURANTOIN MONOHYD MACRO 100 MG PO CAPS: 100.0000 mg | ORAL_CAPSULE | Freq: Two times a day (BID) | ORAL | 0 refills | Status: AC

## 2021-05-31 NOTE — Telephone Encounter (Signed)
Pt calling; antibx is not at the pharm; please send rx.  [phone] ?

## 2021-06-04 ENCOUNTER — Telehealth: Payer: Self-pay

## 2021-06-04 LAB — URINE CULTURE

## 2021-06-04 NOTE — Telephone Encounter (Signed)
Pt calling for lab results, can you send in a rx since [facility] is out for the day ?

## 2021-06-05 ENCOUNTER — Other Ambulatory Visit: Payer: Self-pay | Admitting: Licensed Practical Nurse

## 2021-06-05 ENCOUNTER — Other Ambulatory Visit: Payer: Self-pay

## 2021-06-05 DIAGNOSIS — N3000 Acute cystitis without hematuria: Secondary | ICD-10-CM

## 2021-06-05 MED ORDER — AMOXICILLIN 500 MG PO CAPS: 500.0000 mg | ORAL_CAPSULE | Freq: Three times a day (TID) | ORAL | 0 refills | Status: DC

## 2021-06-05 MED ORDER — AMOXICILLIN 500 MG PO CAPS: 500.0000 mg | ORAL_CAPSULE | Freq: Three times a day (TID) | ORAL | 0 refills | Status: AC

## 2021-06-05 NOTE — Telephone Encounter (Signed)
Pt aware of Rx, she had me send it to the [facility] out pt pharmacy.

## 2021-07-25 ENCOUNTER — Ambulatory Visit: Payer: BC Managed Care – PPO | Admitting: Obstetrics
